# Patient Record
Sex: Female | Born: 1990 | Race: Black or African American | Hispanic: No | Marital: Single | Smoking: Current every day smoker
Health system: Southern US, Community
[De-identification: ages and names within clinical notes are randomized; demographics above are authoritative.]

---

## 2015-02-01 DIAGNOSIS — K219 Gastro-esophageal reflux disease without esophagitis: Secondary | ICD-10-CM | POA: Insufficient documentation

## 2015-02-01 DIAGNOSIS — F419 Anxiety disorder, unspecified: Secondary | ICD-10-CM | POA: Insufficient documentation

## 2015-02-01 DIAGNOSIS — F32A Depression, unspecified: Secondary | ICD-10-CM | POA: Insufficient documentation

## 2015-04-18 DIAGNOSIS — E669 Obesity, unspecified: Secondary | ICD-10-CM | POA: Insufficient documentation

## 2017-08-26 DIAGNOSIS — Z720 Tobacco use: Secondary | ICD-10-CM | POA: Insufficient documentation

## 2020-06-11 ENCOUNTER — Ambulatory Visit: Payer: Self-pay | Admitting: Family Medicine

## 2020-06-11 ENCOUNTER — Encounter: Payer: Self-pay | Admitting: Family Medicine

## 2020-06-11 ENCOUNTER — Other Ambulatory Visit: Payer: Self-pay

## 2020-06-11 DIAGNOSIS — Z113 Encounter for screening for infections with a predominantly sexual mode of transmission: Secondary | ICD-10-CM

## 2020-06-11 DIAGNOSIS — B3731 Acute candidiasis of vulva and vagina: Secondary | ICD-10-CM

## 2020-06-11 DIAGNOSIS — B9689 Other specified bacterial agents as the cause of diseases classified elsewhere: Secondary | ICD-10-CM

## 2020-06-11 DIAGNOSIS — B373 Candidiasis of vulva and vagina: Secondary | ICD-10-CM

## 2020-06-11 DIAGNOSIS — F419 Anxiety disorder, unspecified: Secondary | ICD-10-CM

## 2020-06-11 DIAGNOSIS — N760 Acute vaginitis: Secondary | ICD-10-CM

## 2020-06-11 LAB — WET PREP FOR TRICH, YEAST, CLUE
Trichomonas Exam: NEGATIVE
Yeast Exam: NEGATIVE

## 2020-06-11 MED ORDER — METRONIDAZOLE 500 MG PO TABS: 500.0000 mg | ORAL_TABLET | Freq: Two times a day (BID) | ORAL | 0 refills | Status: AC

## 2020-06-11 MED ORDER — CLOTRIMAZOLE 1 % VA CREA: 1.0000 | TOPICAL_CREAM | Freq: Every day | VAGINAL | 0 refills | Status: AC

## 2020-06-11 NOTE — Progress Notes (Signed)
Presents for STD screen, reports symptoms. Declines blood works. Sharlyne Pacas, RN   Results reviewed with provider. Medication to treat BV per standing order dispensed. Medication for yeast dispensed per standing order. Pt counseled to take oral medication BID for 7 days. Counseled to abstain from sex and alcohol until full treatment course is completed. Counseled to to use vaginal cream for external itching/irritation. Pt verbalizes understanding. BV handout given, condoms declined. Sharlyne Pacas, RN

## 2020-06-11 NOTE — Progress Notes (Signed)
[facility] Department STI clinic/screening visit  Subjective:  Kim French is a 29 y.o. female being seen today for an STI screening visit. The patient reports they do have symptoms.  Patient reports that they do not desire a pregnancy in the next year.   They reported they are not interested in discussing contraception today.  Patient's last menstrual period was 05/11/2020.   Patient has the following medical conditions:  There are no problems to display for this patient.   Chief Complaint  Patient presents with  . SEXUALLY TRANSMITTED DISEASE    STD screening except bloodwork    HPI  Patient reports having symptoms of vaginal itching and discharge.   Last HIV test per patient/review of record was 3 years ago  Patient reports last pap was 2 years ago   See flowsheet for further details and programmatic requirements.    The following portions of the patient's history were reviewed and updated as appropriate: allergies, current medications, past medical history, past social history, past surgical history and problem list.  Objective:  There were no vitals filed for this visit.  Physical Exam Constitutional:      Appearance: Normal appearance. She is obese.  HENT:     Head: Normocephalic.     Comments: In scalp, brows and lashes: no nits, no hair loss    Mouth/Throat:     Mouth: Mucous membranes are moist.     Pharynx: Oropharynx is clear. No oropharyngeal exudate or posterior oropharyngeal erythema.  Abdominal:     Palpations: Abdomen is soft. There is no mass.     Tenderness: There is no abdominal tenderness. There is no guarding.  Genitourinary:    Comments: External genitalia without, lice, nits, erythema, edema , lesions or inguinal adenopathy.  Linear cuts and drided tan colored discharge on inner labia.  Vagina with normal mucosa and discharge adherent vaginal wall and pooled in vaginal canal and pH >4.  Cervix without visual lesions, uterus firm,  mobile, non-tender, no masses, CMT adnexal fullness or tenderness.  Musculoskeletal:     Cervical back: Normal range of motion and neck supple.  Lymphadenopathy:     Cervical: No cervical adenopathy.  Neurological:     Mental Status: She is alert and oriented to person, place, and time.  Psychiatric:        Mood and Affect: Mood normal.        Behavior: Behavior normal.      Assessment and Plan:  Kim French is a 29 y.o. female presenting to the [facility] Department for STI screening   1. Screening examination for venereal disease  - Chlamydia/Gonorrhea [facility] - WET PREP FOR TRICH, YEAST, CLUE - Chlamydia/Gonorrhea [facility]  Patient accepted all screenings including  Oral & vaginal CT/GC, and wet prep. Patient declines  bloodwork for HIV/RPR, HBV, and HCV. Patient meets criteria for HepB screening? No. Ordered? Patient declined Patient meets criteria for HepC screening? Yes. Ordered? No - patient declined   2. Bacterial vaginosis  - metroNIDAZOLE (FLAGYL) 500 MG tablet; Take 1 tablet (500 mg total) by mouth 2 (two) times daily for 7 days.  Dispense: 14 tablet; Refill: 0  3. Yeast vaginitis  - clotrimazole (V-R CLOTRIMAZOLE VAGINAL) 1 % vaginal cream; Place 1 Applicatorful vaginally at bedtime for 7 days. Use externally for itching  Dispense: 45 g; Refill: 0  Wet prep results patient has pos. Clue, Treat for BV and yeast r/t exam findings, of odor, >4 pH and  discharge  Discussed time line for [facility] results and that patient will be called with positive results and encouraged patient to call if she had not heard in 2 weeks.  Counseled to return or seek care for continued or worsening symptoms Recommended condom use with all sex  Patient is currently using not using method  to prevent pregnancy.   4. Anxiety -Ambulatory referral to Behavioral Health referral to [facility]       No follow-ups on file.  No future appointments.  Wendi Snipes, FNP

## 2021-06-30 ENCOUNTER — Emergency Department
Admission: EM | Admit: 2021-06-30 | Discharge: 2021-06-30 | Disposition: A | Discharge status: Home / Self Care | Payer: Self-pay | Attending: Student in an Organized Health Care Education/Training Program | Admitting: Student in an Organized Health Care Education/Training Program

## 2021-06-30 ENCOUNTER — Other Ambulatory Visit: Payer: Self-pay

## 2021-06-30 DIAGNOSIS — Z20822 Contact with and (suspected) exposure to covid-19: Secondary | ICD-10-CM | POA: Insufficient documentation

## 2021-06-30 DIAGNOSIS — J029 Acute pharyngitis, unspecified: Secondary | ICD-10-CM | POA: Insufficient documentation

## 2021-06-30 DIAGNOSIS — F17210 Nicotine dependence, cigarettes, uncomplicated: Secondary | ICD-10-CM | POA: Insufficient documentation

## 2021-06-30 LAB — RESP PANEL BY RT-PCR (FLU A&B, COVID) ARPGX2
Influenza A by PCR: NEGATIVE
Influenza B by PCR: NEGATIVE
SARS Coronavirus 2 by RT PCR: NEGATIVE

## 2021-06-30 MED ORDER — ACETAMINOPHEN 500 MG PO TABS
1000.0000 mg | ORAL_TABLET | Freq: Once | ORAL | Status: AC
  Administered 2021-06-30: 1000 mg via ORAL
  Filled 2021-06-30: qty 2

## 2021-06-30 MED ORDER — DEXAMETHASONE 6 MG PO TABS
10.0000 mg | ORAL_TABLET | Freq: Once | ORAL | Status: AC
  Administered 2021-06-30: 10 mg via ORAL
  Filled 2021-06-30: qty 1

## 2021-06-30 MED ORDER — AMOXICILLIN 500 MG PO CAPS: 500.0000 mg | ORAL_CAPSULE | Freq: Two times a day (BID) | ORAL | 0 refills | Status: AC

## 2021-06-30 NOTE — ED Triage Notes (Signed)
Pt here with tonsil pain x 3 days. Pt states that it is painful to swallow. Pt denies N/V/D. Pt in NAD in triage.

## 2021-06-30 NOTE — ED Provider Notes (Signed)
[facility] Emergency Department Provider Note    Event Date/Time   First MD Initiated Contact with Patient 06/30/21 1423     (approximate)  I have reviewed the triage vital signs and the nursing notes.   HISTORY  Chief Complaint Sore Throat    HPI Kim French is a 30 y.o. female presents to the ER for evaluation of sore throat that been present worsening the past few days.  No known sick contacts.  No trouble swallowing but does have pain with swallowing.  No change in phonation.  Denies any pain or discomfort under her tongue.  No recent antibiotics.  No past medical history on file. No family history on file. No past surgical history on file. Patient Active Problem List   Diagnosis Date Noted   Tobacco use 08/26/2017   Obesity (BMI 30.0-34.9) 04/18/2015   Anxiety and depression 02/01/2015   Gastroesophageal reflux disease 02/01/2015      Prior to Admission medications   Medication Sig Start Date End Date Taking? Authorizing Provider  amoxicillin (AMOXIL) 500 MG capsule Take 1 capsule (500 mg total) by mouth 2 (two) times daily for 7 days. 06/30/21 07/07/21 Yes Willy Eddy, MD    Allergies Patient has no known allergies.    Social History Social History   Tobacco Use   Smoking status: Every Day    Packs/day: 0.25    Years: 6.00    Pack years: 1.50    Types: Cigarettes   Smokeless tobacco: Never  Vaping Use   Vaping Use: Never used  Substance Use Topics   Alcohol use: Yes    Comment: Patient drinks heavy can not tell number of shots    Drug use: Yes    Frequency: 4.0 times per week    Types: Marijuana    Review of Systems Patient denies headaches, rhinorrhea, blurry vision, numbness, shortness of breath, chest pain, edema, cough, abdominal pain, nausea, vomiting, diarrhea, dysuria, fevers, rashes or hallucinations unless otherwise stated above in HPI. ____________________________________________   PHYSICAL  EXAM:  VITAL SIGNS: Vitals:   06/30/21 1357 06/30/21 1523  BP: 128/82 122/85  Pulse: 68 72  Resp: 18 20  Temp: 98.4 F (36.9 C)   SpO2: 97% 100%    Constitutional: Alert and oriented. Well appearing and in no acute distress. Eyes: Conjunctivae are normal.  Head: Atraumatic. Nose: No congestion/rhinnorhea. Mouth/Throat: Mucous membranes are moist.  Uvula is midline and nonerythematous she does have bilateral tonsillar edema erythema with purulent exudates.  No findings to suggest PTA or RPA.  No trismus.  No sublingual erythema or edema. Neck: Painless ROM.  Cardiovascular:   Good peripheral circulation. Respiratory: Normal respiratory effort.  No retractions.  Gastrointestinal: Soft and nontender.  Musculoskeletal: No lower extremity tenderness .  No joint effusions. Neurologic:  Normal speech and language. No gross focal neurologic deficits are appreciated.  Skin:  Skin is warm, dry and intact. No rash noted. Psychiatric: Mood and affect are normal. Speech and behavior are normal.  ____________________________________________   LABS (all labs ordered are listed, but only abnormal results are displayed)  No results found for this or any previous visit (from the past 24 hour(s)). ____________________________________________ ____________________________________________  RADIOLOGY   ____________________________________________   PROCEDURES  Procedure(s) performed:  Procedures    Critical Care performed: no ____________________________________________   INITIAL IMPRESSION / ASSESSMENT AND PLAN / ED COURSE  Pertinent labs & imaging results that were available during my care of the patient were reviewed by me and  considered in my medical decision making (see chart for details).   DDX: uri, flu, covid, strep, pta, rpa,   Kim French is a 30 y.o. who presents to the ED with presentation as described above.  Patient well nontoxic exam as above.  Will send for viral  etiologies but have a higher suspicion for strep throat therefore will order prescription for antibiotics we discussed if her viral panel came back positive for flu or COVID that was most likely cause of her symptoms and therefore we will treat with Decadron for the pain.  Is not consistent with RPA or PTA.  Patient agreeable to plan.    The patient was evaluated in Emergency Department today for the symptoms described in the history of present illness. He/she was evaluated in the context of the global COVID-19 pandemic, which necessitated consideration that the patient might be at risk for infection with the SARS-CoV-2 virus that causes COVID-19. Institutional protocols and algorithms that pertain to the evaluation of patients at risk for COVID-19 are in a state of rapid change based on information released by regulatory bodies including the CDC and federal and state organizations. These policies and algorithms were followed during the patient's care in the ED.   ____________________________________________   FINAL CLINICAL IMPRESSION(S) / ED DIAGNOSES  Final diagnoses:  Pharyngitis, unspecified etiology      NEW MEDICATIONS STARTED DURING THIS VISIT:  Discharge Medication List as of 06/30/2021  3:13 PM     START taking these medications   Details  amoxicillin (AMOXIL) 500 MG capsule Take 1 capsule (500 mg total) by mouth 2 (two) times daily for 7 days., Starting Tue 06/30/2021, Until Tue 07/07/2021, Normal         Note:  This document was prepared using Dragon voice recognition software and may include unintentional dictation errors.     Willy Eddy, MD 06/30/21 1537

## 2021-08-15 ENCOUNTER — Emergency Department: Payer: Medicaid Other

## 2021-08-15 ENCOUNTER — Other Ambulatory Visit: Payer: Self-pay

## 2021-08-15 ENCOUNTER — Emergency Department
Admission: EM | Admit: 2021-08-15 | Discharge: 2021-08-15 | Disposition: A | Discharge status: Home / Self Care | Payer: Medicaid Other | Attending: Emergency Medicine | Admitting: Emergency Medicine

## 2021-08-15 DIAGNOSIS — F17210 Nicotine dependence, cigarettes, uncomplicated: Secondary | ICD-10-CM | POA: Insufficient documentation

## 2021-08-15 DIAGNOSIS — U071 COVID-19: Secondary | ICD-10-CM | POA: Insufficient documentation

## 2021-08-15 LAB — RESP PANEL BY RT-PCR (FLU A&B, COVID) ARPGX2
Influenza A by PCR: NEGATIVE
Influenza B by PCR: NEGATIVE
SARS Coronavirus 2 by RT PCR: POSITIVE — AB

## 2021-08-15 MED ORDER — ACETAMINOPHEN 500 MG PO TABS
1000.0000 mg | ORAL_TABLET | Freq: Once | ORAL | Status: AC
  Administered 2021-08-15: 1000 mg via ORAL
  Filled 2021-08-15: qty 2

## 2021-08-15 MED ORDER — NIRMATRELVIR/RITONAVIR (PAXLOVID)TABLET: 3.0000 | ORAL_TABLET | Freq: Two times a day (BID) | ORAL | 0 refills | Status: AC

## 2021-08-15 NOTE — ED Provider Notes (Signed)
°  Emergency Medicine Provider Triage Evaluation Note  Kim French , a 30 y.o.female,  was evaluated in triage.  Pt complains of flulike symptoms for the past 2 days.  Endorses cough, congestion, chest tightness, and body aches.  She says her coworker recently had influenza.    Review of Systems  Positive: Cough, congestion, chest tightness, body aches. Negative: Denies fever, abdominal pain, vomiting  Physical Exam   Vitals:   08/15/21 1100  BP: (!) 142/93  Pulse: (!) 106  Resp: 20  Temp: 99.9 F (37.7 C)  SpO2: 99%   Gen:   Awake, no distress   Resp:  Normal effort  MSK:   Moves extremities without difficulty  Other:    Medical Decision Making  Given the patient's initial medical screening exam, the following diagnostic evaluation has been ordered. The patient will be placed in the appropriate treatment space, once one is available, to complete the evaluation and treatment. I have discussed the plan of care with the patient and I have advised the patient that an ED physician or mid-level practitioner will reevaluate their condition after the test results have been received, as the results may give them additional insight into the type of treatment they may need.    Diagnostics: Respiratory panel, chest x-ray  Treatments: Acetaminophen   Varney Daily, PA 08/15/21 1106    Jene Every, MD 08/15/21 1133

## 2021-08-15 NOTE — ED Provider Notes (Signed)
[facility] Emergency Department Provider Note   ____________________________________________    I have reviewed the triage vital signs and the nursing notes.   HISTORY  Chief Complaint Nasal Congestion     HPI Kim French is a 30 y.o. female with history as noted below who presents with complaints of nasal congestion, body aches and chills which started this morning.  She reports a coworker has influenza.  She has not been vaccinated against influenza or COVID.  No shortness of breath, no significant cough  History reviewed. No pertinent past medical history.  Patient Active Problem List   Diagnosis Date Noted   Tobacco use 08/26/2017   Obesity (BMI 30.0-34.9) 04/18/2015   Anxiety and depression 02/01/2015   Gastroesophageal reflux disease 02/01/2015    History reviewed. No pertinent surgical history.  Prior to Admission medications   Medication Sig Start Date End Date Taking? Authorizing Provider  nirmatrelvir/ritonavir EUA (PAXLOVID) 20 x 150 MG & 10 x 100MG  TABS Take 3 tablets by mouth 2 (two) times daily for 5 days. Patient GFR is 60. Take nirmatrelvir (150 mg) two tablets twice daily for 5 days and ritonavir (100 mg) one tablet twice daily for 5 days. 08/15/21 08/20/21 Yes 10/18/21, MD     Allergies Patient has no known allergies.  No family history on file.  Social History Social History   Tobacco Use   Smoking status: Every Day    Packs/day: 0.25    Years: 6.00    Pack years: 1.50    Types: Cigarettes   Smokeless tobacco: Never  Vaping Use   Vaping Use: Never used  Substance Use Topics   Alcohol use: Yes    Comment: Patient drinks heavy can not tell number of shots    Drug use: Yes    Frequency: 4.0 times per week    Types: Marijuana    Review of Systems  Constitutional: Chills  ENT: Nasal congestion   Gastrointestinal: No abdominal pain.  No nausea, no vomiting.   Genitourinary: Negative for  dysuria. Musculoskeletal: Myalgias Skin: Negative for rash. Neurological: Negative for headaches     ____________________________________________   PHYSICAL EXAM:  VITAL SIGNS: ED Triage Vitals  Enc Vitals Group     BP 08/15/21 1100 (!) 142/93     Pulse Rate 08/15/21 1100 (!) 106     Resp 08/15/21 1100 20     Temp 08/15/21 1100 99.9 F (37.7 C)     Temp Source 08/15/21 1100 Oral     SpO2 08/15/21 1100 99 %     Weight 08/15/21 1100 91.2 kg (201 lb)     Height 08/15/21 1100 1.651 m (5\' 5" )     Head Circumference --      Peak Flow --      Pain Score 08/15/21 1106 8     Pain Loc --      Pain Edu? --      Excl. in GC? --      Constitutional: Alert and oriented. No acute distress. Pleasant and interactive Eyes: Conjunctivae are normal.  Head: Atraumatic. Nose: No congestion/rhinnorhea. Mouth/Throat: Mucous membranes are moist.   Cardiovascular: Normal rate, regular rhythm.  Respiratory: Normal respiratory effort.  No retractions. Genitourinary: deferred Musculoskeletal: No lower extremity tenderness nor edema.   Neurologic:  Normal speech and language. No gross focal neurologic deficits are appreciated.   Skin:  Skin is warm, dry and intact. No rash noted.   ____________________________________________   LABS (all labs ordered  are listed, but only abnormal results are displayed)  Labs Reviewed  RESP PANEL BY RT-PCR (FLU A&B, COVID) ARPGX2 - Abnormal; Notable for the following components:      Result Value   SARS Coronavirus 2 by RT PCR POSITIVE (*)    All other components within normal limits   ____________________________________________  EKG   ____________________________________________  RADIOLOGY  Chest x-ray reviewed by me, no infiltrate or effusion ____________________________________________   PROCEDURES  Procedure(s) performed: No  Procedures   Critical Care performed: No ____________________________________________   INITIAL  IMPRESSION / ASSESSMENT AND PLAN / ED COURSE  Pertinent labs & imaging results that were available during my care of the patient were reviewed by me and considered in my medical decision making (see chart for details).   Patient overall well-appearing and in no acute distress, PCR is returned positive for COVID-19.  She is not vaccinated so offered paxlovid which she is excepted.  Discussed the side effects including metallic taste and diarrhea  Appropriate for discharge with supportive care, return precautions discussed   ____________________________________________   FINAL CLINICAL IMPRESSION(S) / ED DIAGNOSES  Final diagnoses:  COVID-19      NEW MEDICATIONS STARTED DURING THIS VISIT:  New Prescriptions   NIRMATRELVIR/RITONAVIR EUA (PAXLOVID) 20 X 150 MG & 10 X 100MG  TABS    Take 3 tablets by mouth 2 (two) times daily for 5 days. Patient GFR is 60. Take nirmatrelvir (150 mg) two tablets twice daily for 5 days and ritonavir (100 mg) one tablet twice daily for 5 days.     Note:  This document was prepared using Dragon voice recognition software and may include unintentional dictation errors.    , MD 08/15/21 [phone]

## 2021-08-15 NOTE — ED Notes (Signed)
Signature pad not working, Chief of Staff accepted.

## 2021-08-15 NOTE — ED Triage Notes (Signed)
Pt states that she started having cough and congestion this AM- pt also having generalized body aches- pt states her manger tested positive for flu

## 2022-08-13 ENCOUNTER — Emergency Department
Admission: EM | Admit: 2022-08-13 | Discharge: 2022-08-13 | Discharge status: Against Medical Advice | Payer: Medicaid Other

## 2022-10-27 NOTE — Progress Notes (Signed)
New patient visit   Patient: Kim French   DOB: Aug 07, 1991   32 y.o. Female  MRN: [id number] Visit Date: 10/28/2022  Today's healthcare provider: Eulis Foster, MD   Chief Complaint  Patient presents with   Establish Care   Subjective    Kim French is a 32 y.o. female who presents today as a new patient to establish care.   Patient presents today to establish care. Patient is concerned about her blood pressure, reports it was elevated at recent ED visit when she had the flu. She reports family history of heart conditions, stroke in her maternal grandmother, no known heart attacks. Patient is also concerned about developing diabetes. She reports DM in her maternal grandmother. Patient has not had routine medical care in several years.     Past Med Hx  Anxiety & depression - feels that these symptoms were more situational, she thinks she was stressed and on birth control at the time and the medication made her feel strange, so she stopped the medication, she is not sure how long ago this was   Obesity: she reports gaining a lot of weight during COVID, she would be interested in helps   Tobacco Use: she reports smoking a pack of cigarettes every 2-3 days, she has been smoking for about 10 years, she thinks she would have an easier time stopping alcohol than she would be able to stop with cigarettes, she has tried the gum in the past, she has tried the patches, she reports some fear of medication, she states that the gum did not help decrease cravings, on scale of 0-10 she is at a 7 for wanting to quit cigarettes, she states that her sister recently quit smoking and drinking alcohol and they have been talking about quitting   At risk Alcohol Use: patient reports that she has been drinking heavily for almost four years since [place], she lost both of her parents and grandparents, her dad passed when she was 54 and her mother when she was 85, she describes herself as a functioning  alcoholic, she reports drinking 1/2 a pint per day of liquor, she has been able to go 2-3 days without alcohol, she reports that she has right sided abdominal pain when she is drinking, she attributes her increased drinking to the isolation during COVID, she will sometimes drink until she has right sided stabbing abdominal pains, she denies nausea with the abdominal pain, she reports that she is never "drunk" that she describes as blacking out, vomiting, staggering, she reports concern that no one can tell she has been drinking She states that 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 No pertinent past medical history. History reviewed. No pertinent surgical history. Family Status  Relation Name Status   Mother  (Not Specified)   Father  (Not Specified)  Son  (Not Specified)   MGM  (Not Specified)   Family History  Problem Relation Age of Onset   Kidney disease Mother    Hypertension Mother    Hypertension Father    Liver disease Father    Asthma Son    Diabetes Maternal Grandmother    Stroke Maternal Grandmother    Social History   Socioeconomic History   Marital status: Single    Spouse name: Not on file   Number of children: Not on file   Years of education: Not on file   Highest education level: Not on file  Occupational History   Not on file  Tobacco Use   Smoking status: Every Day    Packs/day: 0.25     Years: 6.00    Additional pack years: 0.00    Total pack years: 1.50    Types: Cigarettes   Smokeless tobacco: Never  Vaping Use   Vaping Use: Never used  Substance and Sexual Activity   Alcohol use: Yes    Comment: Patient drinks heavy can not tell number of shots    Drug use: Yes    Frequency: 4.0 times per week    Types: Marijuana   Sexual activity: Yes    Birth control/protection: None, Condom  Other Topics Concern   Not on file  Social History Narrative   Not on file   Social Determinants of Health   Financial Resource Strain: Not on file  Food Insecurity: Not on file  Transportation Needs: Not on file  Physical Activity: Not on file  Stress: Not on file  Social Connections: Not on file   No outpatient medications prior to visit.   No facility-administered medications prior to visit.   No Known Allergies  Immunization History  Administered Date(s) Administered   HPV 9-valent 08/26/2017, 10/07/2017, 04/04/2018   Tdap 05/24/2012    Health Maintenance  Topic Date Due   HIV Screening  Never done   Hepatitis C Screening  Never done   PAP SMEAR-Modifier  Never done   DTaP/Tdap/Td (2 - Td or Tdap) 05/24/2022   HPV VACCINES  Completed   INFLUENZA VACCINE  Discontinued   COVID-19 Vaccine  Discontinued    Patient Care Team: Eulis Foster, MD as PCP - General (Family Medicine)  Review of Systems  All other systems reviewed and are negative.      Objective    BP 116/72   Pulse 83   Temp 98.4 F (36.9 C) (Oral)   Ht 5\' 5"  (1.651 m)   Wt 230 lb (104.3 kg)   LMP 10/13/2022 (Approximate)   SpO2 98%   BMI 38.27 kg/m    Physical Exam Vitals reviewed.  Constitutional:      General: She is not in acute distress.    Appearance: Normal appearance. She is obese. She is not ill-appearing, toxic-appearing or diaphoretic.  Eyes:     Conjunctiva/sclera: Conjunctivae normal.  Neck:     Thyroid: No thyroid mass, thyromegaly or thyroid tenderness.      Vascular: No carotid bruit.  Cardiovascular:     Rate and Rhythm: Normal rate and regular rhythm.     Pulses: Normal pulses.     Heart sounds: Normal heart sounds. No murmur heard.    No friction rub. No gallop.  Pulmonary:     Effort: Pulmonary effort is normal. No respiratory distress.     Breath sounds: Normal breath sounds. No stridor. No wheezing, rhonchi or rales.  Abdominal:  General: Bowel sounds are normal. There is no distension.     Palpations: Abdomen is soft.     Tenderness: There is no abdominal tenderness.  Musculoskeletal:     Right lower leg: No edema.     Left lower leg: No edema.  Lymphadenopathy:     Cervical: No cervical adenopathy.  Skin:    Findings: No erythema or rash.  Neurological:     Mental Status: She is alert and oriented to person, place, and time.  Psychiatric:        Mood and Affect: Mood is anxious.      Depression Screen    10/28/2022    4:03 PM  PHQ 2/9 Scores  PHQ - 2 Score 2  PHQ- 9 Score 16   No results found for any visits on 10/28/22.  Assessment & Plan      Problem List Items Addressed This Visit       Other   Anxiety and depression    Chronic issue  Reports this was more situational than long term  Patient did not continue Zoloft  She does not wish to restart medication for symptoms of anxiety nor depression  States that she does not feel anxious nor depressed       Relevant Medications   buPROPion (WELLBUTRIN XL) 150 MG 24 hr tablet   Tobacco use - Primary    Chronic  She smokes daily both cigarettes and frequently marijuana  Counseled patient on importance of smoking cessation to decrease risk of HTN, lung disease/CA, and cardiovascular adverse events  Motivational interviewing completed today  Recommended trial of Wellbutrin to help with smoking cessation  She will start with 150mg  daily for three days followed by 300mg  daily  She would like to set quit dates as 11/15/22        Relevant Medications    buPROPion (WELLBUTRIN XL) 150 MG 24 hr tablet   Alcohol use disorder, mild, abuse    Chronic issue  Patient wishes to cut back on drinking  She has not gotten into trouble regarding her drinking but she is nervous that she won't be able to hind her patterns for long  She reports drinking a pint of alcohol most days  Reviewed AUDIT and CAGE questions  Prescribed naltrexone 50mg  daily        Relevant Medications   naltrexone (DEPADE) 50 MG tablet     Return in about 3 weeks (around 11/18/2022) for f/u smoking cessation.       The entirety of the information documented in the History of Present Illness, Review of Systems and Physical Exam were personally obtained by me. Portions of this information were initially documented by Pricilla Handler, [place] . I, Eulis Foster, MD have reviewed the documentation above for thoroughness and accuracy.      Eulis Foster, MD  [facility] [phone] (phone) [phone] (fax)  [address]

## 2022-10-28 ENCOUNTER — Ambulatory Visit (INDEPENDENT_AMBULATORY_CARE_PROVIDER_SITE_OTHER): Payer: Medicaid Other | Admitting: Family Medicine

## 2022-10-28 ENCOUNTER — Encounter: Payer: Self-pay | Admitting: Family Medicine

## 2022-10-28 VITALS — BP 116/72 | HR 83 | Temp 98.4°F | Ht 65.0 in | Wt 230.0 lb

## 2022-10-28 DIAGNOSIS — F1010 Alcohol abuse, uncomplicated: Secondary | ICD-10-CM

## 2022-10-28 DIAGNOSIS — Z720 Tobacco use: Secondary | ICD-10-CM

## 2022-10-28 DIAGNOSIS — F419 Anxiety disorder, unspecified: Secondary | ICD-10-CM | POA: Diagnosis not present

## 2022-10-28 DIAGNOSIS — F32A Depression, unspecified: Secondary | ICD-10-CM

## 2022-10-28 MED ORDER — NALTREXONE HCL 50 MG PO TABS: 25.0000 mg | ORAL_TABLET | Freq: Every day | ORAL | 2 refills | Status: AC

## 2022-10-28 MED ORDER — BUPROPION HCL ER (XL) 150 MG PO TB24: ORAL_TABLET | ORAL | 1 refills | Status: AC

## 2022-10-28 NOTE — Patient Instructions (Signed)
Please follow up with me in 3 weeks for smoking cessation and alcohol consumption   I have prescribed Wellbutrin for the help with the weight and smoking: start '150mg'$  for the first three days and then increase to '150mg'$  twice daily.   For the naltrexone, start taking '50mg'$  daily.   Managing the Challenge of Quitting Smoking Quitting smoking is a physical and mental challenge. You may have cravings, withdrawal symptoms, and temptation to smoke. Before quitting, work with your health care provider to make a plan that can help you manage quitting. Making a plan before you quit may keep you from smoking when you have the urge to smoke while trying to quit. How to manage lifestyle changes Managing stress Stress can make you want to smoke, and wanting to smoke may cause stress. It is important to find ways to manage your stress. You could try some of the following: Practice relaxation techniques. Breathe slowly and deeply, in through your nose and out through your mouth. Listen to music. Soak in a bath or take a shower. Imagine a peaceful place or vacation. Get some support. Talk with family or friends about your stress. Join a support group. Talk with a counselor or therapist. Get some physical activity. Go for a walk, run, or bike ride. Play a favorite sport. Practice yoga.  Medicines Talk with your health care provider about medicines that might help you deal with cravings and make quitting easier for you. Relationships Social situations can be difficult when you are quitting smoking. To manage this, you can: Avoid parties and other social situations where people might be smoking. Avoid alcohol. Leave right away if you have the urge to smoke. Explain to your family and friends that you are quitting smoking. Ask for support and let them know you might be a bit grumpy. Plan activities where smoking is not an option. General instructions Be aware that many people gain weight after they  quit smoking. However, not everyone does. To keep from gaining weight, have a plan in place before you quit, and stick to the plan after you quit. Your plan should include: Eating healthy snacks. When you have a craving, it may help to: Eat popcorn, or try carrots, celery, or other cut vegetables. Chew sugar-free gum. Changing how you eat. Eat small portion sizes at meals. Eat 4-6 small meals throughout the day instead of 1-2 large meals a day. Be mindful when you eat. You should avoid watching television or doing other things that might distract you as you eat. Exercising regularly. Make time to exercise each day. If you do not have time for a long workout, do short bouts of exercise for 5-10 minutes several times a day. Do some form of strengthening exercise, such as weight lifting. Do some exercise that gets your heart beating and causes you to breathe deeply, such as walking fast, running, swimming, or biking. This is very important. Drinking plenty of water or other low-calorie or no-calorie drinks. Drink enough fluid to keep your urine pale yellow.  How to recognize withdrawal symptoms Your body and mind may experience discomfort as you try to get used to not having nicotine in your system. These effects are called withdrawal symptoms. They may include: Feeling hungrier than normal. Having trouble concentrating. Feeling irritable or restless. Having trouble sleeping. Feeling depressed. Craving a cigarette. These symptoms may surprise you, but they are normal to have when quitting smoking. To manage withdrawal symptoms: Avoid places, people, and activities that trigger your cravings.  Remember why you want to quit. Get plenty of sleep. Avoid coffee and other drinks that contain caffeine. These may worsen some of your symptoms. How to manage cravings Come up with a plan for how to deal with your cravings. The plan should include the following: A definition of the specific situation  you want to deal with. An activity or action you will take to replace smoking. A clear idea for how this action will help. The name of someone who could help you with this. Cravings usually last for 5-10 minutes. Consider taking the following actions to help you with your plan to deal with cravings: Keep your mouth busy. Chew sugar-free gum. Suck on hard candies or a straw. Brush your teeth. Keep your hands and body busy. Change to a different activity right away. Squeeze or play with a ball. Do an activity or a hobby, such as making bead jewelry, practicing needlepoint, or working with wood. Mix up your normal routine. Take a short exercise break. Go for a quick walk, or run up and down stairs. Focus on doing something kind or helpful for someone else. Call a friend or family member to talk during a craving. Join a support group. Contact a quitline. Where to find support To get help or find a support group: Call the [employer]'s Smoking Quitline: 1-800-QUIT-NOW 919 735 8337) Text QUIT to SmokefreeTXTAZ:4618977 Where to find more information Visit these websites to find more information on quitting smoking: U.S. Department of Health and Human Services: www.smokefree.gov American Lung Association: www.freedomfromsmoking.org Centers for Disease Control and Prevention (CDC): http://www.wolf.info/ American Heart Association: www.heart.org Contact a health care provider if: You want to change your plan for quitting. The medicines you are taking are not helping. Your eating feels out of control or you cannot sleep. You feel depressed or become very anxious. Summary Quitting smoking is a physical and mental challenge. You will face cravings, withdrawal symptoms, and temptation to smoke again. Preparation can help you as you go through these challenges. Try different techniques to manage stress, handle social situations, and prevent weight gain. You can deal with cravings by keeping your  mouth busy (such as by chewing gum), keeping your hands and body busy, calling family or friends, or contacting a quitline for people who want to quit smoking. You can deal with withdrawal symptoms by avoiding places where people smoke, getting plenty of rest, and avoiding drinks that contain caffeine. This information is not intended to replace advice given to you by your health care provider. Make sure you discuss any questions you have with your health care provider. Document Revised: 07/24/2021 Document Reviewed: 07/24/2021 Elsevier Patient Education  [place].

## 2022-10-29 DIAGNOSIS — F1010 Alcohol abuse, uncomplicated: Secondary | ICD-10-CM | POA: Insufficient documentation

## 2022-10-29 NOTE — Assessment & Plan Note (Signed)
Chronic issue  Patient wishes to cut back on drinking  She has not gotten into trouble regarding her drinking but she is nervous that she won't be able to hind her patterns for long  She reports drinking a pint of alcohol most days  Reviewed AUDIT and CAGE questions  Prescribed naltrexone 50mg  daily

## 2022-10-29 NOTE — Assessment & Plan Note (Addendum)
Chronic  She smokes daily both cigarettes and frequently marijuana  Counseled patient on importance of smoking cessation to decrease risk of HTN, lung disease/CA, and cardiovascular adverse events  Motivational interviewing completed today  Recommended trial of Wellbutrin to help with smoking cessation  She will start with 150mg  daily for three days followed by 300mg  daily  She would like to set quit dates as 11/15/22

## 2022-10-29 NOTE — Assessment & Plan Note (Signed)
Chronic issue  Reports this was more situational than long term  Patient did not continue Zoloft  She does not wish to restart medication for symptoms of anxiety nor depression  States that she does not feel anxious nor depressed

## 2022-11-17 NOTE — Progress Notes (Signed)
Established patient visit   Patient: Kim French   DOB: 01-Nov-1990   32 y.o. Female  MRN: [id number] Visit Date: 11/18/2022  Today's healthcare provider: Ronnald Ramp, MD   No chief complaint on file.  Subjective    HPI  Follow up for smoking cessation  The patient was last seen for this 3 weeks ago. Changes made at last visit include  Wellbutrin to help with smoking cessation  start with 150mg  daily for three days followed by 300mg  daily  She would like to set quit dates as 11/15/22 .  She reports  no  compliance with treatment. She feels that condition is Unchanged. She is not having side effects.  Has not started medication  Follow up for alcohol use disorder, mild  The patient was last seen for this 3 weeks ago. Changes made at last visit include naltrexone 50mg  daily .  She reports poor compliance with treatment. She feels that condition is Unchanged. She is not having side effects.  Patient has not started medication  -----------------------------------------------------------------------------------------  Medications: Outpatient Medications Prior to Visit  Medication Sig   buPROPion (WELLBUTRIN XL) 150 MG 24 hr tablet 150 mg once daily for 3 days; increase to 150 mg twice daily   naltrexone (DEPADE) 50 MG tablet Take 0.5 tablets (25 mg total) by mouth daily.   No facility-administered medications prior to visit.    Review of Systems     Objective    BP (!) 148/91 (BP Location: Left Arm, Patient Position: Sitting, Cuff Size: Normal)   Pulse 87   Temp 98 F (36.7 C) (Oral)   Wt 232 lb 14.4 oz (105.6 kg)   LMP 10/13/2022 (Approximate)   SpO2 97%   BMI 38.76 kg/m    Physical Exam Vitals reviewed.  Constitutional:      General: She is not in acute distress.    Appearance: Normal appearance. She is not ill-appearing, toxic-appearing or diaphoretic.  Eyes:     Conjunctiva/sclera: Conjunctivae normal.  Neck:     Thyroid: No thyroid  mass, thyromegaly or thyroid tenderness.     Vascular: No carotid bruit.  Cardiovascular:     Rate and Rhythm: Normal rate and regular rhythm.     Pulses: Normal pulses.     Heart sounds: Normal heart sounds. No murmur heard.    No friction rub. No gallop.  Pulmonary:     Effort: Pulmonary effort is normal. No respiratory distress.     Breath sounds: Normal breath sounds. No stridor. No wheezing, rhonchi or rales.  Abdominal:     General: Bowel sounds are normal. There is no distension.     Palpations: Abdomen is soft.     Tenderness: There is no abdominal tenderness.  Musculoskeletal:     Right lower leg: Edema present.     Left lower leg: Edema present.  Lymphadenopathy:     Cervical: No cervical adenopathy.  Skin:    Findings: No erythema or rash.  Neurological:     Mental Status: She is alert and oriented to person, place, and time.      No results found for any visits on 11/18/22.  Assessment & Plan     Problem List Items Addressed This Visit       Other   Obesity (BMI 30.0-34.9)    Chronic  Patient has gained 2 lbs since last visit  She is interested in GLP1 agonist, states that DM runs in her family  Will check A1c given  increased risk factors including alcohol use, obesity, and tobacco use with elevated BP in office today       Relevant Orders   Hemoglobin A1c   Tobacco use - Primary    Smoking cessation counseling provided again today  Patient has not been able to cut back on smoking Prescribed Chantix today, 0.5mg  for days 1-3 and 4-7 0.5mg  twice daily followed by 1mg  twice daily for 12 weeks  Patient given AVS with instructions  F/u in 3-4 weeks        Relevant Orders   CBC   Alcohol use disorder, mild, abuse    Chronic No change in Alcohol consumption Previously prescribed naltrexone Patient stated this medication was too expensive, recommended that patient contact her pharmacy to see you picking up only this medication would be more affordable  and to call our office once she knows the answer to this question Recommended chronic care management referral however patient does not qualify at this time Will follow-up in 1 month  patient to start the naltrexone 50 mg daily if able to pick it up from the pharmacy      Relevant Orders   CBC   Elevated blood pressure reading    New problem  Emphasized the importance of decreasing alcohol consumption and smoking cessation as these can contribute to elevated blood pressures Will check CMP, CBC and hemoglobin A1c today No medications prescribed at this time Will follow-up blood pressure follow-up visit in 1 month      Relevant Orders   Comprehensive metabolic panel   Hemoglobin A1c   Encounter for tobacco use cessation counseling    Counseled patient on importance of smoking cessation Patient states that she is still interested in quitting smoking tobacco Patient prescribed Chantix with instructions in AVS Recommended patient follow-up in 1 month Counseled patient on risk of continued tobacco use including elevated blood pressure, peripheral vascular disease, heart failure Patient voiced understanding      Alcohol cessation counseling    Discussed with patient recent use of alcohol consumption Recommended that patient pick up naltrexone prescription to help with alcohol cravings Discussed with patient is dangers of excessive alcohol consumption including hepatic damage, increased risk for heart failure, increased risk for hypertension and diabetes as well as increased risk for legal consequences if she drinks and drives Patient voiced understanding and is interested in cutting back on alcohol consumption        Return in about 4 weeks (around 12/16/2022) for smoking cessation, blood presure .        The entirety of the information documented in the History of Present Illness, Review of Systems and Physical Exam were personally obtained by me. Portions of this information were  initially documented by Hetty Ely, CMA . I, Ronnald Ramp, MD have reviewed the documentation above for thoroughness and accuracy.      Ronnald Ramp, MD  [facility] [phone] (phone) [phone] (fax)  [facility]

## 2022-11-18 ENCOUNTER — Ambulatory Visit (INDEPENDENT_AMBULATORY_CARE_PROVIDER_SITE_OTHER): Payer: Medicaid Other | Admitting: Family Medicine

## 2022-11-18 ENCOUNTER — Encounter: Payer: Self-pay | Admitting: Family Medicine

## 2022-11-18 VITALS — BP 148/91 | HR 87 | Temp 98.0°F | Wt 232.9 lb

## 2022-11-18 DIAGNOSIS — F1010 Alcohol abuse, uncomplicated: Secondary | ICD-10-CM

## 2022-11-18 DIAGNOSIS — Z716 Tobacco abuse counseling: Secondary | ICD-10-CM

## 2022-11-18 DIAGNOSIS — Z720 Tobacco use: Secondary | ICD-10-CM | POA: Diagnosis not present

## 2022-11-18 DIAGNOSIS — R030 Elevated blood-pressure reading, without diagnosis of hypertension: Secondary | ICD-10-CM

## 2022-11-18 DIAGNOSIS — Z7141 Alcohol abuse counseling and surveillance of alcoholic: Secondary | ICD-10-CM | POA: Diagnosis not present

## 2022-11-18 DIAGNOSIS — E669 Obesity, unspecified: Secondary | ICD-10-CM

## 2022-11-18 MED ORDER — VARENICLINE TARTRATE (STARTER) 0.5 MG X 11 & 1 MG X 42 PO TBPK: ORAL_TABLET | ORAL | 1 refills | Status: AC

## 2022-11-18 NOTE — Assessment & Plan Note (Signed)
Discussed with patient recent use of alcohol consumption Recommended that patient pick up naltrexone prescription to help with alcohol cravings Discussed with patient is dangers of excessive alcohol consumption including hepatic damage, increased risk for heart failure, increased risk for hypertension and diabetes as well as increased risk for legal consequences if she drinks and drives Patient voiced understanding and is interested in cutting back on alcohol consumption

## 2022-11-18 NOTE — Assessment & Plan Note (Signed)
Smoking cessation counseling provided again today  Patient has not been able to cut back on smoking Prescribed Chantix today, 0.5mg  for days 1-3 and 4-7 0.5mg  twice daily followed by 1mg  twice daily for 12 weeks  Patient given AVS with instructions  F/u in 3-4 weeks

## 2022-11-18 NOTE — Patient Instructions (Signed)
It was a pleasure to see you today!  Thank you for choosing [facility] for your primary care.   Kim French was seen for smoking cessation and alcohol use.   Our plans for today were: I have sent a prescription for the Chantix to take to help with smoking cessation.  Days 1 to 3: 0.5 mg once daily.  Days 4 to 7: 0.5 mg twice daily. On day 8, start taking 1 mg twice daily I am unable to submit the referral for the care coordination team so please call the pharmacy and see if you can get the naltrexone to help with the alcohol.  We will test the blood work today.    You should return to our clinic in 1 month for smoking cessation and alcohol use.   Best Wishes,   Dr. Quentin Cornwall

## 2022-11-18 NOTE — Assessment & Plan Note (Signed)
Counseled patient on importance of smoking cessation Patient states that she is still interested in quitting smoking tobacco Patient prescribed Chantix with instructions in AVS Recommended patient follow-up in 1 month Counseled patient on risk of continued tobacco use including elevated blood pressure, peripheral vascular disease, heart failure Patient voiced understanding

## 2022-11-18 NOTE — Assessment & Plan Note (Signed)
Chronic No change in Alcohol consumption Previously prescribed naltrexone Patient stated this medication was too expensive, recommended that patient contact her pharmacy to see you picking up only this medication would be more affordable and to call our office once she knows the answer to this question Recommended chronic care management referral however patient does not qualify at this time Will follow-up in 1 month  patient to start the naltrexone 50 mg daily if able to pick it up from the pharmacy

## 2022-11-18 NOTE — Assessment & Plan Note (Signed)
New problem  Emphasized the importance of decreasing alcohol consumption and smoking cessation as these can contribute to elevated blood pressures Will check CMP, CBC and hemoglobin A1c today No medications prescribed at this time Will follow-up blood pressure follow-up visit in 1 month

## 2022-11-18 NOTE — Assessment & Plan Note (Signed)
Chronic  Patient has gained 2 lbs since last visit  She is interested in GLP1 agonist, states that DM runs in her family  Will check A1c given increased risk factors including alcohol use, obesity, and tobacco use with elevated BP in office today

## 2022-11-19 LAB — HEMOGLOBIN A1C
Est. average glucose Bld gHb Est-mCnc: 123 mg/dL
Hgb A1c MFr Bld: 5.9 % — ABNORMAL HIGH (ref 4.8–5.6)

## 2022-11-19 LAB — COMPREHENSIVE METABOLIC PANEL
ALT: 17 IU/L (ref 0–32)
AST: 14 IU/L (ref 0–40)
Albumin/Globulin Ratio: 1.2 (ref 1.2–2.2)
Albumin: 4 g/dL (ref 3.9–4.9)
Alkaline Phosphatase: 73 IU/L (ref 44–121)
BUN/Creatinine Ratio: 21 (ref 9–23)
BUN: 15 mg/dL (ref 6–20)
Bilirubin Total: 0.4 mg/dL (ref 0.0–1.2)
CO2: 18 mmol/L — ABNORMAL LOW (ref 20–29)
Calcium: 9.2 mg/dL (ref 8.7–10.2)
Chloride: 103 mmol/L (ref 96–106)
Creatinine, Ser: 0.71 mg/dL (ref 0.57–1.00)
Globulin, Total: 3.3 g/dL (ref 1.5–4.5)
Glucose: 85 mg/dL (ref 70–99)
Potassium: 4.3 mmol/L (ref 3.5–5.2)
Sodium: 138 mmol/L (ref 134–144)
Total Protein: 7.3 g/dL (ref 6.0–8.5)
eGFR: 117 mL/min/{1.73_m2} (ref >59)

## 2022-11-19 LAB — CBC
Hematocrit: 37.5 % (ref 34.0–46.6)
Hemoglobin: 11.9 g/dL (ref 11.1–15.9)
MCH: 26.4 pg — ABNORMAL LOW (ref 26.6–33.0)
MCHC: 31.7 g/dL (ref 31.5–35.7)
MCV: 83 fL (ref 79–97)
Platelets: 296 10*3/uL (ref 150–450)
RBC: 4.51 x10E6/uL (ref 3.77–5.28)
RDW: 14.5 % (ref 11.7–15.4)
WBC: 12.1 10*3/uL — ABNORMAL HIGH (ref 3.4–10.8)

## 2022-12-20 ENCOUNTER — Ambulatory Visit: Payer: Medicaid Other | Admitting: Family Medicine

## 2023-08-08 IMAGING — CR DG CHEST 2V
1 series · 2 of 2 positions shown · non-contrast
Comparison: None.

CLINICAL DATA: Shortness of breath.

EXAM:
CHEST - 2 VIEW

[Series 1: w chest pa · 0.14mm/px · 2 of 2 slices shown]
[im 1/2]
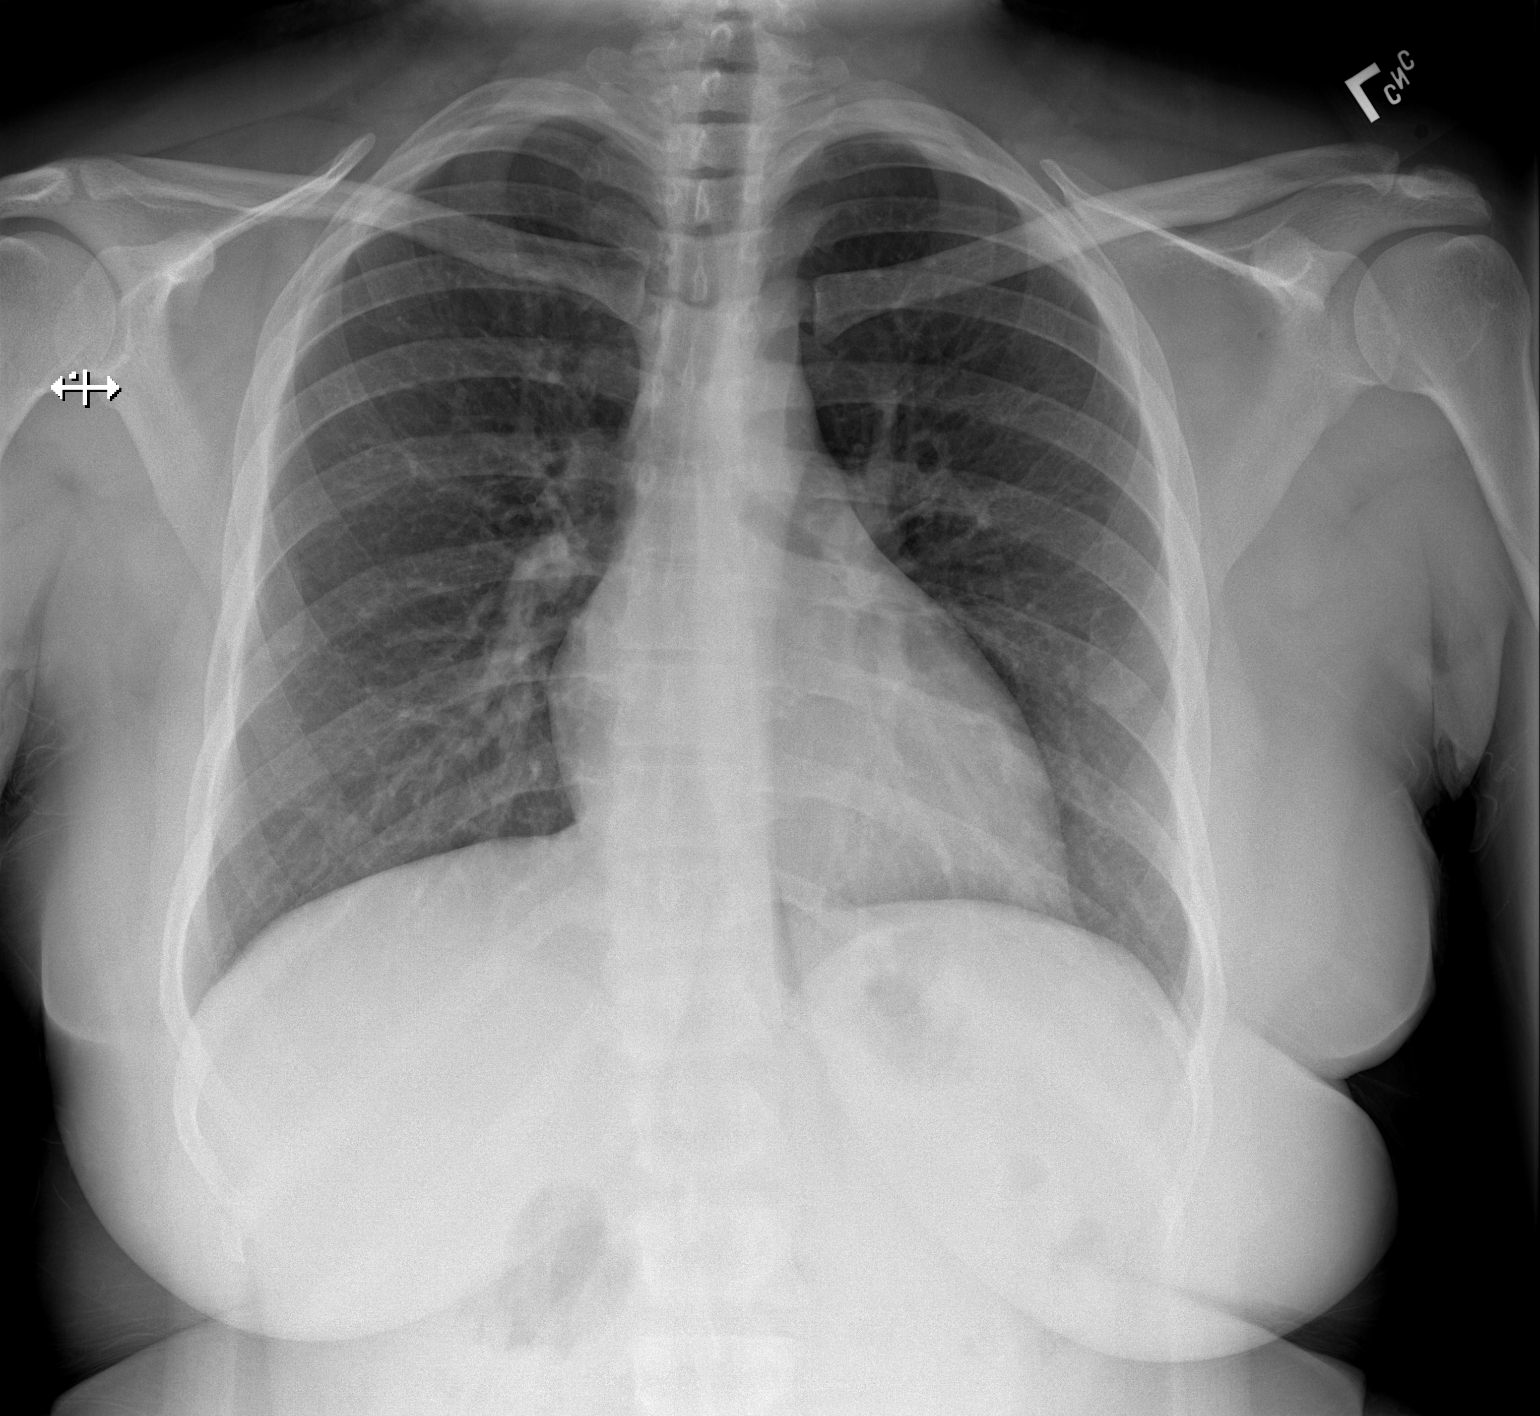
[im 2/2]
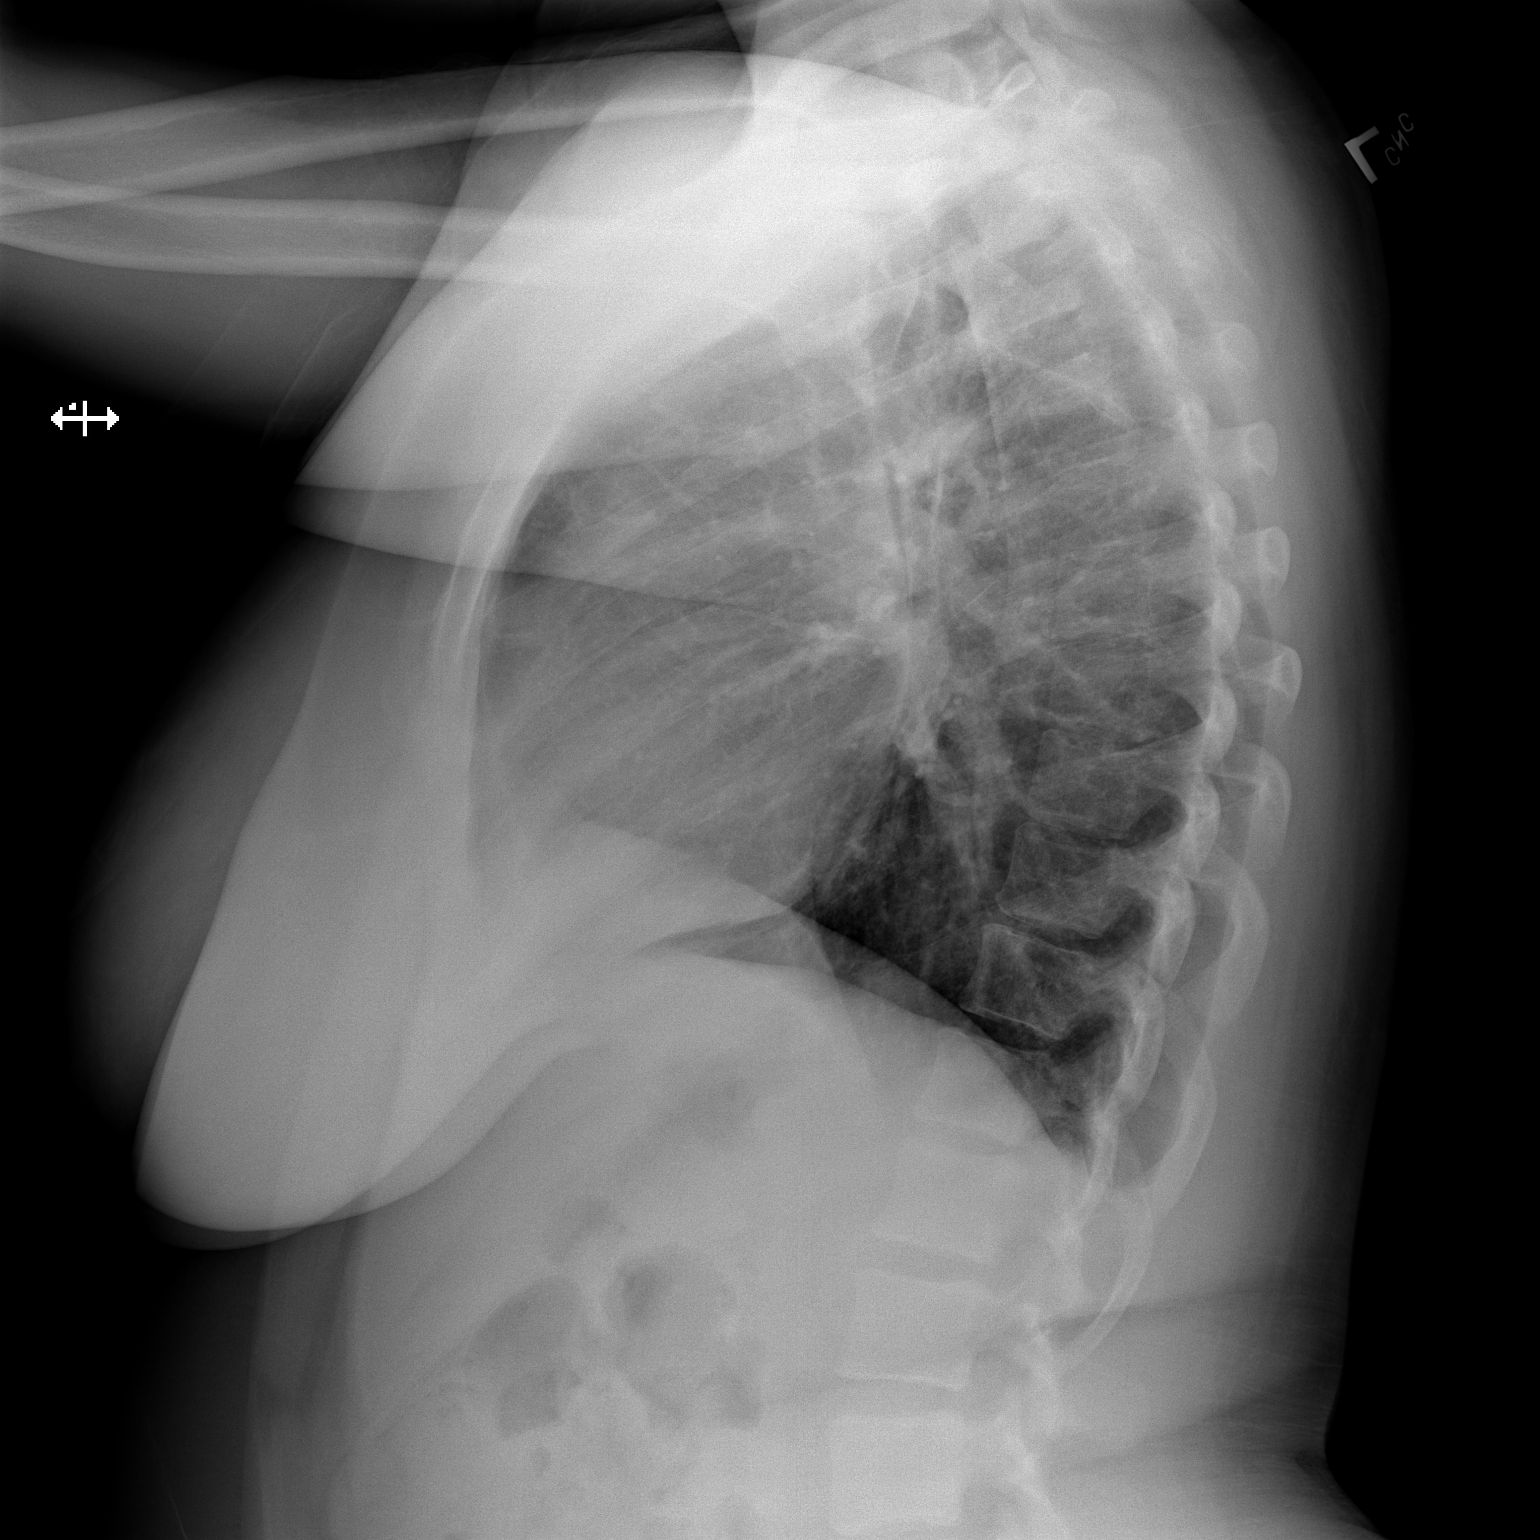

[2 of 2 positions shown; findings below may reference images not displayed]

FINDINGS: The heart size and mediastinal contours are within normal limits.
Both lungs are clear. The visualized skeletal structures are
unremarkable.
IMPRESSION: No active cardiopulmonary disease.
